# Patient Record
Sex: Female | Born: 1980 | Race: White | Hispanic: No | Marital: Single | Smoking: Never smoker
Health system: Southern US, Community
[De-identification: ages and names within clinical notes are randomized; demographics above are authoritative.]

## PROBLEM LIST (undated history)

## (undated) DIAGNOSIS — R011 Cardiac murmur, unspecified: Secondary | ICD-10-CM

## (undated) DIAGNOSIS — R87629 Unspecified abnormal cytological findings in specimens from vagina: Secondary | ICD-10-CM

## (undated) DIAGNOSIS — Z8489 Family history of other specified conditions: Secondary | ICD-10-CM

## (undated) DIAGNOSIS — T8859XA Other complications of anesthesia, initial encounter: Secondary | ICD-10-CM

## (undated) HISTORY — PX: REMOVAL OF BILATERAL TISSUE EXPANDERS WITH PLACEMENT OF BILATERAL BREAST IMPLANTS: SHX6431

## (undated) HISTORY — PX: FACIAL RECONSTRUCTION SURGERY: SHX631

## (undated) HISTORY — DX: Unspecified abnormal cytological findings in specimens from vagina: R87.629

## (undated) HISTORY — PX: CRYOTHERAPY: SHX1416

---

## 2016-07-12 ENCOUNTER — Ambulatory Visit (INDEPENDENT_AMBULATORY_CARE_PROVIDER_SITE_OTHER): Payer: BLUE CROSS/BLUE SHIELD | Admitting: Family Medicine

## 2016-07-12 ENCOUNTER — Encounter: Payer: Self-pay | Admitting: Family Medicine

## 2016-07-12 VITALS — BP 119/75 | HR 52 | Ht 66.0 in | Wt 163.0 lb

## 2016-07-12 DIAGNOSIS — Z113 Encounter for screening for infections with a predominantly sexual mode of transmission: Secondary | ICD-10-CM | POA: Diagnosis not present

## 2016-07-12 DIAGNOSIS — N941 Unspecified dyspareunia: Secondary | ICD-10-CM

## 2016-07-12 DIAGNOSIS — Z124 Encounter for screening for malignant neoplasm of cervix: Secondary | ICD-10-CM

## 2016-07-12 DIAGNOSIS — Z01419 Encounter for gynecological examination (general) (routine) without abnormal findings: Secondary | ICD-10-CM | POA: Diagnosis not present

## 2016-07-12 DIAGNOSIS — N939 Abnormal uterine and vaginal bleeding, unspecified: Secondary | ICD-10-CM | POA: Diagnosis not present

## 2016-07-12 DIAGNOSIS — Z1151 Encounter for screening for human papillomavirus (HPV): Secondary | ICD-10-CM | POA: Diagnosis not present
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
## 2016-07-17 LAB — CYTOLOGY - PAP

## 2016-09-16 ENCOUNTER — Encounter (HOSPITAL_BASED_OUTPATIENT_CLINIC_OR_DEPARTMENT_OTHER): Payer: Self-pay | Admitting: *Deleted

## 2016-09-16 ENCOUNTER — Emergency Department (HOSPITAL_BASED_OUTPATIENT_CLINIC_OR_DEPARTMENT_OTHER)
Admission: EM | Admit: 2016-09-16 | Discharge: 2016-09-16 | Disposition: A | Discharge status: Home / Self Care | Payer: BLUE CROSS/BLUE SHIELD | Attending: Emergency Medicine | Admitting: Emergency Medicine

## 2016-09-16 DIAGNOSIS — X58XXXA Exposure to other specified factors, initial encounter: Secondary | ICD-10-CM | POA: Diagnosis not present

## 2016-09-16 DIAGNOSIS — Y999 Unspecified external cause status: Secondary | ICD-10-CM | POA: Insufficient documentation

## 2016-09-16 DIAGNOSIS — Y939 Activity, unspecified: Secondary | ICD-10-CM | POA: Insufficient documentation

## 2016-09-16 DIAGNOSIS — S161XXA Strain of muscle, fascia and tendon at neck level, initial encounter: Secondary | ICD-10-CM | POA: Insufficient documentation

## 2016-09-16 DIAGNOSIS — S199XXA Unspecified injury of neck, initial encounter: Secondary | ICD-10-CM | POA: Diagnosis present

## 2016-09-16 DIAGNOSIS — Y929 Unspecified place or not applicable: Secondary | ICD-10-CM | POA: Insufficient documentation

## 2016-09-16 MED ORDER — OXYCODONE-ACETAMINOPHEN 5-325 MG PO TABS: 1.0000 | ORAL_TABLET | ORAL | 0 refills | Status: DC | PRN

## 2016-09-16 MED ORDER — NAPROXEN 500 MG PO TABS: 500.0000 mg | ORAL_TABLET | Freq: Two times a day (BID) | ORAL | 0 refills | Status: DC

## 2016-09-16 MED ORDER — ORPHENADRINE CITRATE ER 100 MG PO TB12: 100.0000 mg | ORAL_TABLET | Freq: Two times a day (BID) | ORAL | 0 refills | Status: DC

## 2016-09-16 NOTE — ED Triage Notes (Signed)
Pt reports right shoulder pain x 3 days.  Denies known injury.  Increased pain with movement.

## 2016-09-16 NOTE — Discharge Instructions (Signed)
Apply ice as needed. Take acetaminophen as needed.

## 2016-09-16 NOTE — ED Provider Notes (Signed)
[facility]   CSN: [id number] Arrival date & time: 09/16/16  0010     History   Chief Complaint Chief Complaint  Patient presents with  . Shoulder Pain    HPI Nancy Hendricks is a 35 y.o. female.  She has had pain in the superior aspect of her shoulder adjacent to her neck for the last 3 days. Pain is only on the right side and she describes a dull, throbbing pain. Is worse with certain movements and worse with swallowing and sneezing. She has been taking ibuprofen without relief. She tried applying ice without relief. Heat seemed to help yesterday, but then pain recurred today and got worse when she applied heat. Symptoms started one day after she had done some overhead work with weights at the gym. She denies weakness, numbness, tingling. She currently rates pain at 8/10 at rest, 10/10 with movement.   The history is provided by the patient.    Past Medical History:  Diagnosis Date  . Vaginal Pap smear, abnormal     There are no active problems to display for this patient.   Past Surgical History:  Procedure Laterality Date  . CRYOTHERAPY      OB History    Gravida Para Term Preterm AB Living   0 0 0 0 0 0   SAB TAB Ectopic Multiple Live Births   0 0 0 0 0       Home Medications    Prior to Admission medications   Not on File    Family History Family History  Problem Relation Age of Onset  . Hypertension Father   . Cancer Neg Hx   . Diabetes Neg Hx     Social History Social History  Substance Use Topics  . Smoking status: Never Smoker  . Smokeless tobacco: Never Used  . Alcohol use 1.2 oz/week    2 Glasses of wine per week     Allergies   Patient has no known allergies.   Review of Systems Review of Systems  All other systems reviewed and are negative.    Physical Exam Updated Vital Signs BP 146/98 (BP Location: Left Arm)   Pulse 60   Temp 98.1 F (36.7 C) (Oral)   Resp 18   Ht 5\' 6"  (1.676 m)   Wt 160  lb (72.6 kg)   LMP 09/09/2016   SpO2 98%   BMI 25.82 kg/m   Physical Exam  Nursing note and vitals reviewed.  35 year old female, resting comfortably and in no acute distress. Vital signs are significant for hypertension. Oxygen saturation is 98%, which is normal. Head is normocephalic and atraumatic. PERRLA, EOMI. Oropharynx is clear. Neck has moderate spasm of paracervical muscles bilaterally without tenderness. There is pain on passive range of motion of the neck with rotation and lateral flexion in either direction. There is no adenopathy or JVD. Back is nontender and there is no CVA tenderness. Lungs are clear without rales, wheezes, or rhonchi. Chest is nontender. Heart has regular rate and rhythm without murmur. Abdomen is soft, flat, nontender without masses or hepatosplenomegaly and peristalsis is normoactive. Extremities have no cyanosis or edema, full range of motion is present. Skin is warm and dry without rash. There is mild tenderness to the soft tissues of the proximal aspect of the deltoid muscle on the right. Neurologic: Mental status is normal, cranial nerves are intact, there are no motor or sensory deficits.  ED Treatments / Results   Procedures  Procedures (including critical care time)  Medications Ordered in ED Medications - No data to display   Initial Impression / Assessment and Plan / ED Course  I have reviewed the triage vital signs and the nursing notes.   Clinical Course    Proximal shoulder pain which seems to be related to strain of paracervical muscles. No evidence of any neurologic injury. She is sad discharged with prescriptions for orphenadrine, naproxen, and oxycodone have acetaminophen. Advised on using ice. Referred to sports medicine for follow-up.  Final Clinical Impressions(s) / ED Diagnoses   Final diagnoses:  Strain of neck muscle, initial encounter    New Prescriptions New Prescriptions   NAPROXEN (NAPROSYN) 500 MG TABLET     Take 1 tablet (500 mg total) by mouth 2 (two) times daily.   ORPHENADRINE (NORFLEX) 100 MG TABLET    Take 1 tablet (100 mg total) by mouth 2 (two) times daily.   OXYCODONE-ACETAMINOPHEN (PERCOCET) 5-325 MG TABLET    Take 1 tablet by mouth every 4 (four) hours as needed for moderate pain.     Dione Boozeavid Nyeem Stoke, MD 09/16/16 [phone]

## 2018-12-08 ENCOUNTER — Other Ambulatory Visit: Payer: Self-pay | Admitting: *Deleted

## 2018-12-08 DIAGNOSIS — Z124 Encounter for screening for malignant neoplasm of cervix: Secondary | ICD-10-CM

## 2018-12-09 NOTE — Progress Notes (Signed)
Patient: Nancy Hendricks           Date of Birth: Jan 23, 1981           MRN: [id number] Visit Date: 12/08/2018 PCP: Patient, No Pcp Per  Cervical Cancer Screening Do you smoke?: No Have you ever had or been told you have an allergy to latex products?: No Marital status: Divorce Date of last pap smear: 2-5 yrs ago Date of last menstrual period: 11/24/18 Number of pregnancies: 0 Number of births: 0 Have you ever had any of the following? Hysterectomy: No Tubal ligation (tubes tied): No Abnormal bleeding: Don't Know Abnormal pap smear: Yes Venereal warts: No A sex partner with venereal warts: Yes A high risk* sex partner: No  Cervical Exam Exam not completed.  Patient's History There are no active problems to display for this patient.  Past Medical History:  Diagnosis Date  . Vaginal Pap smear, abnormal     Family History  Problem Relation Age of Onset  . Hypertension Father   . Cancer Neg Hx   . Diabetes Neg Hx     Social History   Occupational History  . Not on file  Tobacco Use  . Smoking status: Never Smoker  . Smokeless tobacco: Never Used  Substance and Sexual Activity  . Alcohol use: Yes    Alcohol/week: 2.0 standard drinks    Types: 2 Glasses of wine per week  . Drug use: No  . Sexual activity: Never    Birth control/protection: Condom   Patient examined by [employer], ANP. Visit notes scanned into Epic.

## 2018-12-15 LAB — CYTOLOGY - PAP
Diagnosis: UNDETERMINED
HPV (WINDOPATH): NOT DETECTED

## 2018-12-30 ENCOUNTER — Telehealth (HOSPITAL_COMMUNITY): Payer: Self-pay | Admitting: *Deleted

## 2018-12-30 NOTE — Telephone Encounter (Signed)
Telephoned patient at home number and phone rings and then hangs up.

## 2019-01-09 ENCOUNTER — Telehealth (HOSPITAL_COMMUNITY): Payer: Self-pay | Admitting: *Deleted

## 2019-01-09 NOTE — Telephone Encounter (Signed)
Telephoned patient at home number and left message to return call. Need to advise patient of pap smear results.

## 2019-01-09 NOTE — Telephone Encounter (Signed)
Patient returned call and advised patient of abnormal pap smear results but HPV was negative. Advised patient with that result would just need repeat pap in one year. Patient voiced understanding.

## 2019-01-19 ENCOUNTER — Encounter (HOSPITAL_COMMUNITY): Payer: Self-pay

## 2019-03-04 ENCOUNTER — Telehealth: Payer: Self-pay

## 2019-03-04 NOTE — Telephone Encounter (Signed)
Pt called the office to stating that she needs to schedule a colpo because she had an abnormal pap. Explained to pt that per the note from 01/09/19 she was told that she will need to repeat her pap in one year. Pt got upset and decided to cancel appt. Tahira Olivarez l Anyely Cunning, CMA

## 2019-04-06 ENCOUNTER — Encounter: Payer: Self-pay | Admitting: Family Medicine

## 2019-09-08 ENCOUNTER — Telehealth: Payer: Self-pay | Admitting: Family Medicine

## 2019-09-08 NOTE — Telephone Encounter (Signed)
Patient called to schedule an appointment. I informed her she had an appointment in [facility], but it was canceled by staff she stated and not her. She was not very happy because it's been so long and no one has spoken to her. She stated she did have insurance in June.

## 2019-09-09 ENCOUNTER — Telehealth: Payer: Self-pay

## 2019-09-09 NOTE — Telephone Encounter (Signed)
Called patient and apologized for the confusion for her thinking that she needed another colposcopy and advised that it is correct that she would need another Papsmear in Feb 2021. Patient verbalized understanding and stated that it was okay and that she was informed and will call us closer to february to get an appointment.   Patient verbalized understanding and ended the call.

## 2019-10-14 ENCOUNTER — Encounter: Payer: Self-pay | Admitting: Family Medicine

## 2019-10-22 ENCOUNTER — Ambulatory Visit: Payer: Self-pay | Admitting: Obstetrics & Gynecology

## 2020-01-16 ENCOUNTER — Ambulatory Visit: Payer: Self-pay | Attending: Internal Medicine

## 2020-01-16 DIAGNOSIS — Z23 Encounter for immunization: Secondary | ICD-10-CM

## 2020-01-16 NOTE — Progress Notes (Signed)
   Covid-19 Vaccination Clinic  Name:  Nancy Hendricks    MRN: [id number] DOB: Jun 15, 1981  01/16/2020  [name] was observed post Covid-19 immunization for 15 minutes without incident. She was provided with Vaccine Information Sheet and instruction to access the V-Safe system.   [name] was instructed to call 911 with any severe reactions post vaccine: Marland Kitchen Difficulty breathing  . Swelling of face and throat  . A fast heartbeat  . A bad rash all over body  . Dizziness and weakness   Immunizations Administered    Name Date Dose VIS Date Route   Pfizer COVID-19 Vaccine 01/16/2020  9:33 AM 0.3 mL 09/25/2019 Intramuscular   Manufacturer: [employer], [employer]   Lot: [id number]   NDC: [address]-6

## 2020-02-10 ENCOUNTER — Ambulatory Visit: Payer: Self-pay

## 2020-03-24 ENCOUNTER — Telehealth: Payer: Self-pay | Admitting: Allergy & Immunology

## 2020-03-24 NOTE — Telephone Encounter (Signed)
Left voicemail for patient to call office back regarding voicemail that was left for new patient appointment for allergic reaction.

## 2020-03-29 ENCOUNTER — Ambulatory Visit: Payer: Self-pay | Admitting: Allergy

## 2020-04-19 ENCOUNTER — Ambulatory Visit: Payer: Self-pay | Admitting: Allergy

## 2020-11-06 ENCOUNTER — Other Ambulatory Visit: Payer: Self-pay

## 2020-11-06 ENCOUNTER — Encounter (HOSPITAL_BASED_OUTPATIENT_CLINIC_OR_DEPARTMENT_OTHER): Payer: Self-pay | Admitting: Emergency Medicine

## 2020-11-06 ENCOUNTER — Emergency Department (HOSPITAL_BASED_OUTPATIENT_CLINIC_OR_DEPARTMENT_OTHER): Payer: BLUE CROSS/BLUE SHIELD

## 2020-11-06 ENCOUNTER — Emergency Department (HOSPITAL_BASED_OUTPATIENT_CLINIC_OR_DEPARTMENT_OTHER)
Admission: EM | Admit: 2020-11-06 | Discharge: 2020-11-06 | Disposition: A | Discharge status: Home / Self Care | Payer: BLUE CROSS/BLUE SHIELD | Attending: Emergency Medicine | Admitting: Emergency Medicine

## 2020-11-06 DIAGNOSIS — K8020 Calculus of gallbladder without cholecystitis without obstruction: Secondary | ICD-10-CM | POA: Diagnosis not present

## 2020-11-06 DIAGNOSIS — R1011 Right upper quadrant pain: Secondary | ICD-10-CM

## 2020-11-06 LAB — CBC
HCT: 38.1 % (ref 36.0–46.0)
Hemoglobin: 12.3 g/dL (ref 12.0–15.0)
MCH: 27.5 pg (ref 26.0–34.0)
MCHC: 32.3 g/dL (ref 30.0–36.0)
MCV: 85 fL (ref 80.0–100.0)
Platelets: 226 10*3/uL (ref 150–400)
RBC: 4.48 MIL/uL (ref 3.87–5.11)
RDW: 14.7 % (ref 11.5–15.5)
WBC: 5.6 10*3/uL (ref 4.0–10.5)
nRBC: 0 % (ref 0.0–0.2)

## 2020-11-06 LAB — COMPREHENSIVE METABOLIC PANEL
ALT: 11 U/L (ref 0–44)
AST: 17 U/L (ref 15–41)
Albumin: 3.8 g/dL (ref 3.5–5.0)
Alkaline Phosphatase: 41 U/L (ref 38–126)
Anion gap: 9 (ref 5–15)
BUN: 15 mg/dL (ref 6–20)
CO2: 27 mmol/L (ref 22–32)
Calcium: 9 mg/dL (ref 8.9–10.3)
Chloride: 104 mmol/L (ref 98–111)
Creatinine, Ser: 0.88 mg/dL (ref 0.44–1.00)
GFR, Estimated: >60 mL/min (ref >60)
Glucose, Bld: 100 mg/dL — ABNORMAL HIGH (ref 70–99)
Potassium: 3.9 mmol/L (ref 3.5–5.1)
Sodium: 140 mmol/L (ref 135–145)
Total Bilirubin: 0.2 mg/dL — ABNORMAL LOW (ref 0.3–1.2)
Total Protein: 6.1 g/dL — ABNORMAL LOW (ref 6.5–8.1)

## 2020-11-06 LAB — URINALYSIS, ROUTINE W REFLEX MICROSCOPIC
Bilirubin Urine: NEGATIVE
Glucose, UA: NEGATIVE mg/dL
Ketones, ur: NEGATIVE mg/dL
Leukocytes,Ua: NEGATIVE
Nitrite: NEGATIVE
Protein, ur: NEGATIVE mg/dL
Specific Gravity, Urine: 1.02 (ref 1.005–1.030)
pH: 6.5 (ref 5.0–8.0)

## 2020-11-06 LAB — URINALYSIS, MICROSCOPIC (REFLEX): RBC / HPF: >50 RBC/hpf (ref 0–5)

## 2020-11-06 LAB — PREGNANCY, URINE: Preg Test, Ur: NEGATIVE

## 2020-11-06 MED ORDER — DICYCLOMINE HCL 10 MG/ML IM SOLN
20.0000 mg | Freq: Once | INTRAMUSCULAR | Status: AC
  Administered 2020-11-06: 20 mg via INTRAMUSCULAR
  Filled 2020-11-06: qty 2

## 2020-11-06 MED ORDER — KETOROLAC TROMETHAMINE 30 MG/ML IJ SOLN
30.0000 mg | Freq: Once | INTRAMUSCULAR | Status: AC
  Administered 2020-11-06: 30 mg via INTRAVENOUS
  Filled 2020-11-06: qty 1

## 2020-11-06 MED ORDER — OMEPRAZOLE 20 MG PO CPDR: 20.0000 mg | DELAYED_RELEASE_CAPSULE | Freq: Every day | ORAL | 0 refills | Status: AC

## 2020-11-06 MED ORDER — ALUM & MAG HYDROXIDE-SIMETH 200-200-20 MG/5ML PO SUSP
30.0000 mL | Freq: Once | ORAL | Status: AC
  Administered 2020-11-06: 30 mL via ORAL
  Filled 2020-11-06: qty 30

## 2020-11-06 MED ORDER — DICYCLOMINE HCL 20 MG PO TABS: 20.0000 mg | ORAL_TABLET | Freq: Two times a day (BID) | ORAL | 0 refills | Status: DC

## 2020-11-06 NOTE — ED Triage Notes (Signed)
RUQ abd pain that started ~ 12 hours ago. Pt does endorse nausea and states the pain has worsened over that period. Pt states she is very tender over this area, and when she points to it to show RN she yells out. Denies fevers, vomiting, diarrhea, or constipation.

## 2020-11-06 NOTE — ED Provider Notes (Signed)
[facility] EMERGENCY DEPARTMENT Provider Note   CSN: [id number] Arrival date & time: 11/06/20  [id number]     History Chief Complaint  Patient presents with  . Abdominal Pain    Nancy Bostonngela Todt is a 40 y.o. female.  The history is provided by the patient.  Abdominal Pain Pain location:  RUQ Pain quality: cramping   Pain quality: not burning, no fullness and no pressure   Pain radiates to:  Does not radiate Onset quality:  Sudden Duration:  14 hours Timing:  Constant Progression:  Unchanged Chronicity:  New Context: not retching and not sick contacts   Relieved by:  Nothing Worsened by:  Nothing Ineffective treatments:  None tried Associated symptoms: no anorexia, no belching, no chest pain, no constipation, no cough, no dysuria, no fatigue, no fever, no flatus, no nausea, no shortness of breath, no sore throat and no vomiting   Associated symptoms comment:  Is currently on her period Risk factors: no alcohol abuse   Patient was doing house work at 3 pm and had sudden onset RUQ pain.  Not meal related in fact thought she was hungry and ate and it did not affect the patin.      Past Medical History:  Diagnosis Date  . Vaginal Pap smear, abnormal     There are no problems to display for this patient.   Past Surgical History:  Procedure Laterality Date  . CRYOTHERAPY    . FACIAL RECONSTRUCTION SURGERY       OB History    Gravida  0   Para  0   Term  0   Preterm  0   AB  0   Living  0     SAB  0   IAB  0   Ectopic  0   Multiple  0   Live Births  0           Family History  Problem Relation Age of Onset  . Hypertension Father   . Cancer Neg Hx   . Diabetes Neg Hx     Social History   Tobacco Use  . Smoking status: Never Smoker  . Smokeless tobacco: Never Used  Substance Use Topics  . Alcohol use: Yes    Alcohol/week: 2.0 standard drinks    Types: 2 Glasses of wine per week  . Drug use: No    Home Medications Prior to  Admission medications   Medication Sig Start Date End Date Taking? Authorizing Provider  naproxen (NAPROSYN) 500 MG tablet Take 1 tablet (500 mg total) by mouth 2 (two) times daily. 09/16/16   Dione BoozeGlick, David, MD  orphenadrine (NORFLEX) 100 MG tablet Take 1 tablet (100 mg total) by mouth 2 (two) times daily. 09/16/16   Dione BoozeGlick, David, MD  oxyCODONE-acetaminophen (PERCOCET) 5-325 MG tablet Take 1 tablet by mouth every 4 (four) hours as needed for moderate pain. 09/16/16   Dione BoozeGlick, David, MD    Allergies    Patient has no known allergies.  Review of Systems   Review of Systems  Constitutional: Negative for fatigue and fever.  HENT: Negative for sore throat.   Eyes: Negative for visual disturbance.  Respiratory: Negative for cough and shortness of breath.   Cardiovascular: Negative for chest pain.  Gastrointestinal: Positive for abdominal pain. Negative for anorexia, constipation, flatus, nausea and vomiting.  Genitourinary: Negative for dysuria.  Musculoskeletal: Negative for arthralgias.  Skin: Negative for rash.  Neurological: Negative for dizziness.  Psychiatric/Behavioral: Negative for agitation.  All  other systems reviewed and are negative.   Physical Exam Updated Vital Signs BP (!) 143/91 (BP Location: Right Arm)   Pulse 62   Temp 98.8 F (37.1 C) (Oral)   Resp 18   Ht 5\' 3"  (1.6 m)   Wt 72.6 kg   LMP 11/04/2020   SpO2 100%   BMI 28.34 kg/m   Physical Exam Vitals and nursing note reviewed.  Constitutional:      General: She is not in acute distress.    Appearance: Normal appearance.  HENT:     Head: Normocephalic and atraumatic.     Nose: Nose normal.  Eyes:     Conjunctiva/sclera: Conjunctivae normal.     Pupils: Pupils are equal, round, and reactive to light.  Cardiovascular:     Rate and Rhythm: Normal rate and regular rhythm.     Pulses: Normal pulses.     Heart sounds: Normal heart sounds.  Pulmonary:     Effort: Pulmonary effort is normal.     Breath sounds:  Normal breath sounds.  Abdominal:     General: Abdomen is flat. Bowel sounds are normal.     Palpations: Abdomen is soft.     Tenderness: There is no abdominal tenderness. There is no guarding or rebound.  Musculoskeletal:        General: Normal range of motion.     Cervical back: Normal range of motion and neck supple.  Skin:    General: Skin is warm and dry.     Capillary Refill: Capillary refill takes less than 2 seconds.  Neurological:     General: No focal deficit present.     Mental Status: She is alert and oriented to person, place, and time.     Deep Tendon Reflexes: Reflexes normal.  Psychiatric:        Mood and Affect: Mood normal.        Behavior: Behavior normal.     ED Results / Procedures / Treatments   Labs (all labs ordered are listed, but only abnormal results are displayed) Results for orders placed or performed during the hospital encounter of 11/06/20  Urinalysis, Routine w reflex microscopic  Result Value Ref Range   Color, Urine YELLOW YELLOW   APPearance CLOUDY (A) CLEAR   Specific Gravity, Urine 1.020 1.005 - 1.030   pH 6.5 5.0 - 8.0   Glucose, UA NEGATIVE NEGATIVE mg/dL   Hgb urine dipstick LARGE (A) NEGATIVE   Bilirubin Urine NEGATIVE NEGATIVE   Ketones, ur NEGATIVE NEGATIVE mg/dL   Protein, ur NEGATIVE NEGATIVE mg/dL   Nitrite NEGATIVE NEGATIVE   Leukocytes,Ua NEGATIVE NEGATIVE  Pregnancy, urine  Result Value Ref Range   Preg Test, Ur NEGATIVE NEGATIVE  Comprehensive metabolic panel  Result Value Ref Range   Sodium 140 135 - 145 mmol/L   Potassium 3.9 3.5 - 5.1 mmol/L   Chloride 104 98 - 111 mmol/L   CO2 27 22 - 32 mmol/L   Glucose, Bld 100 (H) 70 - 99 mg/dL   BUN 15 6 - 20 mg/dL   Creatinine, Ser 11/08/20 0.44 - 1.00 mg/dL   Calcium 9.0 8.9 - 9.21 mg/dL   Total Protein 6.1 (L) 6.5 - 8.1 g/dL   Albumin 3.8 3.5 - 5.0 g/dL   AST 17 15 - 41 U/L   ALT 11 0 - 44 U/L   Alkaline Phosphatase 41 38 - 126 U/L   Total Bilirubin 0.2 (L) 0.3 - 1.2  mg/dL   GFR, Estimated 19.4 >17  mL/min   Anion gap 9 5 - 15  CBC  Result Value Ref Range   WBC 5.6 4.0 - 10.5 K/uL   RBC 4.48 3.87 - 5.11 MIL/uL   Hemoglobin 12.3 12.0 - 15.0 g/dL   HCT 01.0 27.2 - 53.6 %   MCV 85.0 80.0 - 100.0 fL   MCH 27.5 26.0 - 34.0 pg   MCHC 32.3 30.0 - 36.0 g/dL   RDW 64.4 03.4 - 74.2 %   Platelets 226 150 - 400 K/uL   nRBC 0.0 0.0 - 0.2 %  Urinalysis, Microscopic (reflex)  Result Value Ref Range   RBC / HPF >50 0 - 5 RBC/hpf   WBC, UA 0-5 0 - 5 WBC/hpf   Bacteria, UA RARE (A) NONE SEEN   Squamous Epithelial / LPF 0-5 0 - 5   US Abdomen Limited RUQ (LIVER/GB)  Result Date: 11/06/2020 CLINICAL DATA:  Right upper quadrant abdominal pain EXAM: ULTRASOUND ABDOMEN LIMITED RIGHT UPPER QUADRANT COMPARISON:  None. FINDINGS: Gallbladder: A tiny amount of layering sludge is seen within the gallbladder lumen. The gallbladder, however, is not distended, there is no gallbladder wall thickening, and no pericholecystic fluid is identified. The sonographic Eulah Pont sign is reportedly positive. Common bile duct: Diameter: 3 mm Liver: No focal lesion identified. Within normal limits in parenchymal echogenicity. Portal vein is patent on color Doppler imaging with normal direction of blood flow towards the liver. Other: None. IMPRESSION: Cholelithiasis without sonographic evidence of acute cholecystitis. Electronically Signed   By: Helyn Numbers MD   On: 11/06/2020 05:23    Radiology No results found.  Procedures Procedures (including critical care time)  Medications Ordered in ED Medications  ketorolac (TORADOL) 30 MG/ML injection 30 mg (30 mg Intravenous Given 11/06/20 0516)  dicyclomine (BENTYL) injection 20 mg (20 mg Intramuscular Given 11/06/20 0521)  alum & mag hydroxide-simeth (MAALOX/MYLANTA) 200-200-20 MG/5ML suspension 30 mL (30 mLs Oral Given 11/06/20 5956)    ED Course  I have reviewed the triage vital signs and the nursing notes.  Pertinent labs & imaging  results that were available during my care of the patient were reviewed by me and considered in my medical decision making (see chart for details).   I am treating the patient as cholelithiasis without cholecystitis.  Have advised medication, bland diet and close follow up with general surgery as an outpatient.  Strict return precautions given.  Nancy Hendricks was evaluated in Emergency Department on 11/06/2020 for the symptoms described in the history of present illness. She was evaluated in the context of the global COVID-19 pandemic, which necessitated consideration that the patient might be at risk for infection with the SARS-CoV-2 virus that causes COVID-19. Institutional protocols and algorithms that pertain to the evaluation of patients at risk for COVID-19 are in a state of rapid change based on information released by regulatory bodies including the CDC and federal and state organizations. These policies and algorithms were followed during the patient's care in the ED.  Final Clinical Impression(s) / ED Diagnoses Return for intractable cough, coughing up blood, fevers >100.4 unrelieved by medication, shortness of breath, intractable vomiting, chest pain, shortness of breath, weakness, numbness, changes in speech, facial asymmetry, abdominal pain, passing out, Inability to tolerate liquids or food, cough, altered mental status or any concerns. No signs of systemic illness or infection. The patient is nontoxic-appearing on exam and vital signs are within normal limits.  I have reviewed the triage vital signs and the nursing notes. Pertinent labs & imaging  results that were available during my care of the patient were reviewed by me and considered in my medical decision making (see chart for details). After history, exam, and medical workup I feel the patient has been appropriately medically screened and is safe for discharge home. Pertinent diagnoses were discussed with the patient. Patient was given  return precautions.    Americus Scheurich, MD 11/06/20 [phone]

## 2020-11-14 ENCOUNTER — Ambulatory Visit: Payer: Self-pay | Admitting: Surgery
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
## 2020-11-15 ENCOUNTER — Encounter (HOSPITAL_COMMUNITY): Payer: Self-pay

## 2020-11-15 ENCOUNTER — Encounter (HOSPITAL_COMMUNITY)
Admission: RE | Admit: 2020-11-15 | Discharge: 2020-11-15 | Disposition: A | Discharge status: Home / Self Care | Payer: BLUE CROSS/BLUE SHIELD | Source: Ambulatory Visit | Attending: Surgery | Admitting: Surgery

## 2020-11-15 ENCOUNTER — Other Ambulatory Visit: Payer: Self-pay

## 2020-11-15 DIAGNOSIS — Z01812 Encounter for preprocedural laboratory examination: Secondary | ICD-10-CM | POA: Diagnosis present

## 2020-11-15 HISTORY — DX: Cardiac murmur, unspecified: R01.1

## 2020-11-15 HISTORY — DX: Other complications of anesthesia, initial encounter: T88.59XA

## 2020-11-15 HISTORY — DX: Family history of other specified conditions: Z84.89

## 2020-11-15 LAB — COMPREHENSIVE METABOLIC PANEL
ALT: 15 U/L (ref 0–44)
AST: 21 U/L (ref 15–41)
Albumin: 4.5 g/dL (ref 3.5–5.0)
Alkaline Phosphatase: 46 U/L (ref 38–126)
Anion gap: 9 (ref 5–15)
BUN: 15 mg/dL (ref 6–20)
CO2: 26 mmol/L (ref 22–32)
Calcium: 9.6 mg/dL (ref 8.9–10.3)
Chloride: 105 mmol/L (ref 98–111)
Creatinine, Ser: 0.88 mg/dL (ref 0.44–1.00)
GFR, Estimated: >60 mL/min (ref >60)
Glucose, Bld: 89 mg/dL (ref 70–99)
Potassium: 4.1 mmol/L (ref 3.5–5.1)
Sodium: 140 mmol/L (ref 135–145)
Total Bilirubin: 0.8 mg/dL (ref 0.3–1.2)
Total Protein: 7.2 g/dL (ref 6.5–8.1)

## 2020-11-15 LAB — CBC WITH DIFFERENTIAL/PLATELET
Abs Immature Granulocytes: 0.02 10*3/uL (ref 0.00–0.07)
Basophils Absolute: 0 10*3/uL (ref 0.0–0.1)
Basophils Relative: 1 %
Eosinophils Absolute: 0.1 10*3/uL (ref 0.0–0.5)
Eosinophils Relative: 1 %
HCT: 41.8 % (ref 36.0–46.0)
Hemoglobin: 13.2 g/dL (ref 12.0–15.0)
Immature Granulocytes: 0 %
Lymphocytes Relative: 32 %
Lymphs Abs: 2 10*3/uL (ref 0.7–4.0)
MCH: 27.5 pg (ref 26.0–34.0)
MCHC: 31.6 g/dL (ref 30.0–36.0)
MCV: 87.1 fL (ref 80.0–100.0)
Monocytes Absolute: 0.3 10*3/uL (ref 0.1–1.0)
Monocytes Relative: 5 %
Neutro Abs: 3.8 10*3/uL (ref 1.7–7.7)
Neutrophils Relative %: 61 %
Platelets: 283 10*3/uL (ref 150–400)
RBC: 4.8 MIL/uL (ref 3.87–5.11)
RDW: 14.7 % (ref 11.5–15.5)
WBC: 6.3 10*3/uL (ref 4.0–10.5)
nRBC: 0 % (ref 0.0–0.2)

## 2020-11-15 LAB — PROTIME-INR
INR: 1 (ref 0.8–1.2)
Prothrombin Time: 13 seconds (ref 11.4–15.2)

## 2020-11-15 LAB — APTT: aPTT: 27 seconds (ref 24–36)

## 2020-11-15 NOTE — Progress Notes (Signed)
DUE TO COVID-19 ONLY ONE VISITOR IS ALLOWED TO COME WITH YOU AND STAY IN THE WAITING ROOM ONLY DURING PRE OP AND PROCEDURE DAY OF SURGERY. THE 1 VISITOR  MAY VISIT WITH YOU AFTER SURGERY IN YOUR PRIVATE ROOM DURING VISITING HOURS ONLY!  YOU NEED TO HAVE A COVID 19 TEST ON___2/07/2021 ____ @_______ , THIS TEST MUST BE DONE BEFORE SURGERY,  COVID TESTING SITE [address] [place] , IT IS ON THE RIGHT GOING OUT [address] APPROXIMATELY  2 MINUTES PAST [employer] ON THE RIGHT. ONCE YOUR COVID TEST IS COMPLETED,  PLEASE BEGIN THE QUARANTINE INSTRUCTIONS AS OUTLINED IN YOUR HANDOUT.                Nancy Hendricks  11/15/2020   Your procedure is scheduled on: 11/28/2020    Report to [facility] Main  Entrance   Report to admitting at    0700 AM     Call this number if you have problems the morning of surgery [phone]    Remember: Do not eat food , candy gum or mints :After Midnight. You may have clear liquids from midnight until 0600am     CLEAR LIQUID DIET   Foods Allowed                                                                       Coffee and tea, regular and decaf                              Plain Jell-O any favor except red or purple                                            Fruit ices (not with fruit pulp)                                      Iced Popsicles                                     Carbonated beverages, regular and diet                                    Cranberry, grape and apple juices Sports drinks like Gatorade Lightly seasoned clear broth or consume(fat free) Sugar, honey syrup   _____________________________________________________________________    BRUSH YOUR TEETH MORNING OF SURGERY AND RINSE YOUR MOUTH OUT, NO CHEWING GUM CANDY OR MINTS.     Take these medicines the morning of surgery with A SIP OF WATER:  prilosec DO NOT TAKE ANY DIABETIC MEDICATIONS DAY OF YOUR SURGERY                                You may not have any metal on your body including  hair pins and              piercings  Do not wear jewelry, make-up, lotions, powders or perfumes, deodorant             Do not wear nail polish on your fingernails.  Do not shave  48 hours prior to surgery.              Men may shave face and neck.   Do not bring valuables to the hospital. [place].  Contacts, dentures or bridgework may not be worn into surgery.  Leave suitcase in the car. After surgery it may be brought to your room.     Patients discharged the day of surgery will not be allowed to drive home. IF YOU ARE HAVING SURGERY AND GOING HOME THE SAME DAY, YOU MUST HAVE AN ADULT TO DRIVE YOU HOME AND BE WITH YOU FOR 24 HOURS. YOU MAY GO HOME BY TAXI OR UBER OR ORTHERWISE, BUT AN ADULT MUST ACCOMPANY YOU HOME AND STAY WITH YOU FOR 24 HOURS.  Name and phone number of your driver:  Special Instructions: N/A              Please read over the following fact sheets you were given: _____________________________________________________________________  [facility] - Preparing for Surgery Before surgery, you can play an important role.  Because skin is not sterile, your skin needs to be as free of germs as possible.  You can reduce the number of germs on your skin by washing with CHG (chlorahexidine gluconate) soap before surgery.  CHG is an antiseptic cleaner which kills germs and bonds with the skin to continue killing germs even after washing. Please DO NOT use if you have an allergy to CHG or antibacterial soaps.  If your skin becomes reddened/irritated stop using the CHG and inform your nurse when you arrive at Short Stay. Do not shave (including legs and underarms) for at least 48 hours prior to the first CHG shower.  You may shave your face/neck. Please follow these instructions carefully:  1.  Shower with CHG Soap the night before surgery and the  morning of Surgery.  2.  If you  choose to wash your hair, wash your hair first as usual with your  normal  shampoo.  3.  After you shampoo, rinse your hair and body thoroughly to remove the  shampoo.                           4.  Use CHG as you would any other liquid soap.  You can apply chg directly  to the skin and wash                       Gently with a scrungie or clean washcloth.  5.  Apply the CHG Soap to your body ONLY FROM THE NECK DOWN.   Do not use on face/ open                           Wound or open sores. Avoid contact with eyes, ears mouth and genitals (private parts).                       Wash face,  Genitals (private parts) with  your normal soap.             6.  Wash thoroughly, paying special attention to the area where your surgery  will be performed.  7.  Thoroughly rinse your body with warm water from the neck down.  8.  DO NOT shower/wash with your normal soap after using and rinsing off  the CHG Soap.                9.  Pat yourself dry with a clean towel.            10.  Wear clean pajamas.            11.  Place clean sheets on your bed the night of your first shower and do not  sleep with pets. Day of Surgery : Do not apply any lotions/deodorants the morning of surgery.  Please wear clean clothes to the hospital/surgery center.  FAILURE TO FOLLOW THESE INSTRUCTIONS MAY RESULT IN THE CANCELLATION OF YOUR SURGERY PATIENT SIGNATURE_________________________________  NURSE SIGNATURE__________________________________  ________________________________________________________________________

## 2020-11-25 ENCOUNTER — Other Ambulatory Visit: Payer: Self-pay | Admitting: Obstetrics and Gynecology

## 2020-11-25 ENCOUNTER — Other Ambulatory Visit (HOSPITAL_COMMUNITY)
Admission: RE | Admit: 2020-11-25 | Discharge: 2020-11-25 | Disposition: A | Discharge status: Home / Self Care | Payer: BLUE CROSS/BLUE SHIELD | Source: Ambulatory Visit | Attending: Surgery | Admitting: Surgery

## 2020-11-25 DIAGNOSIS — Z01812 Encounter for preprocedural laboratory examination: Secondary | ICD-10-CM | POA: Insufficient documentation

## 2020-11-25 DIAGNOSIS — R928 Other abnormal and inconclusive findings on diagnostic imaging of breast: Secondary | ICD-10-CM

## 2020-11-25 DIAGNOSIS — Z20822 Contact with and (suspected) exposure to covid-19: Secondary | ICD-10-CM | POA: Insufficient documentation

## 2020-11-25 DIAGNOSIS — K8020 Calculus of gallbladder without cholecystitis without obstruction: Secondary | ICD-10-CM | POA: Diagnosis present

## 2020-11-25 DIAGNOSIS — K811 Chronic cholecystitis: Secondary | ICD-10-CM | POA: Diagnosis not present

## 2020-11-25 LAB — SARS CORONAVIRUS 2 (TAT 6-24 HRS): SARS Coronavirus 2: NEGATIVE

## 2020-11-26 NOTE — Anesthesia Preprocedure Evaluation (Signed)
Anesthesia Evaluation  Patient identified by MRN, date of birth, ID band Patient awake    Reviewed: Allergy & Precautions, NPO status , Patient's Chart, lab work & pertinent test results  History of Anesthesia Complications Negative for: history of anesthetic complications  Airway Mallampati: II  TM Distance: >3 FB Neck ROM: Full    Dental no notable dental hx. (+) Dental Advisory Given   Pulmonary neg pulmonary ROS,    Pulmonary exam normal        Cardiovascular negative cardio ROS Normal cardiovascular exam     Neuro/Psych negative neurological ROS     GI/Hepatic Neg liver ROS,   Endo/Other  negative endocrine ROS  Renal/GU negative Renal ROS     Musculoskeletal negative musculoskeletal ROS (+)   Abdominal   Peds  Hematology negative hematology ROS (+)   Anesthesia Other Findings   Reproductive/Obstetrics                            Anesthesia Physical Anesthesia Plan  ASA: II  Anesthesia Plan: General   Post-op Pain Management:    Induction: Intravenous  PONV Risk Score and Plan: 4 or greater and Ondansetron, Dexamethasone, Midazolam and Scopolamine patch - Pre-op  Airway Management Planned: Oral ETT  Additional Equipment:   Intra-op Plan:   Post-operative Plan: Extubation in OR  Informed Consent: I have reviewed the patients History and Physical, chart, labs and discussed the procedure including the risks, benefits and alternatives for the proposed anesthesia with the patient or authorized representative who has indicated his/her understanding and acceptance.     Dental advisory given  Plan Discussed with: Anesthesiologist and CRNA  Anesthesia Plan Comments:        Anesthesia Quick Evaluation

## 2020-11-28 ENCOUNTER — Encounter (HOSPITAL_COMMUNITY)
Admission: RE | Disposition: A | Discharge status: Home / Self Care | Payer: Self-pay | Source: Home / Self Care | Attending: Surgery

## 2020-11-28 ENCOUNTER — Encounter (HOSPITAL_COMMUNITY): Payer: Self-pay | Admitting: Surgery

## 2020-11-28 ENCOUNTER — Ambulatory Visit (HOSPITAL_COMMUNITY): Payer: BLUE CROSS/BLUE SHIELD | Admitting: Anesthesiology

## 2020-11-28 ENCOUNTER — Ambulatory Visit (HOSPITAL_COMMUNITY)
Admission: RE | Admit: 2020-11-28 | Discharge: 2020-11-28 | Disposition: A | Discharge status: Home / Self Care | Payer: BLUE CROSS/BLUE SHIELD | Attending: Surgery | Admitting: Surgery

## 2020-11-28 DIAGNOSIS — K811 Chronic cholecystitis: Secondary | ICD-10-CM | POA: Insufficient documentation

## 2020-11-28 DIAGNOSIS — Z20822 Contact with and (suspected) exposure to covid-19: Secondary | ICD-10-CM | POA: Insufficient documentation

## 2020-11-28 HISTORY — PX: CHOLECYSTECTOMY: SHX55

## 2020-11-28 LAB — PREGNANCY, URINE: Preg Test, Ur: NEGATIVE

## 2020-11-28 SURGERY — LAPAROSCOPIC CHOLECYSTECTOMY WITH INTRAOPERATIVE CHOLANGIOGRAM
Anesthesia: General

## 2020-11-28 MED ORDER — CELECOXIB 200 MG PO CAPS
200.0000 mg | ORAL_CAPSULE | Freq: Once | ORAL | Status: AC
  Administered 2020-11-28: 200 mg via ORAL
  Filled 2020-11-28: qty 1

## 2020-11-28 MED ORDER — ROCURONIUM BROMIDE 10 MG/ML (PF) SYRINGE
PREFILLED_SYRINGE | INTRAVENOUS | Status: DC | PRN
  Administered 2020-11-28: 80 mg via INTRAVENOUS

## 2020-11-28 MED ORDER — PROPOFOL 10 MG/ML IV BOLUS
INTRAVENOUS | Status: AC
  Filled 2020-11-28: qty 20

## 2020-11-28 MED ORDER — ATROPINE SULFATE 1 MG/10ML IJ SOSY
PREFILLED_SYRINGE | INTRAMUSCULAR | Status: AC
  Filled 2020-11-28: qty 10

## 2020-11-28 MED ORDER — TRAMADOL HCL 50 MG PO TABS: 50.0000 mg | ORAL_TABLET | Freq: Four times a day (QID) | ORAL | 0 refills | Status: AC | PRN

## 2020-11-28 MED ORDER — ROCURONIUM BROMIDE 10 MG/ML (PF) SYRINGE
PREFILLED_SYRINGE | INTRAVENOUS | Status: AC
  Filled 2020-11-28: qty 10

## 2020-11-28 MED ORDER — ATROPINE SULFATE 0.4 MG/ML IV SOSY
PREFILLED_SYRINGE | INTRAVENOUS | Status: DC | PRN
  Administered 2020-11-28: .2 mg via INTRAVENOUS

## 2020-11-28 MED ORDER — DEXAMETHASONE SODIUM PHOSPHATE 10 MG/ML IJ SOLN
INTRAMUSCULAR | Status: AC
  Filled 2020-11-28: qty 1

## 2020-11-28 MED ORDER — MIDAZOLAM HCL 2 MG/2ML IJ SOLN
INTRAMUSCULAR | Status: DC | PRN
  Administered 2020-11-28: 2 mg via INTRAVENOUS

## 2020-11-28 MED ORDER — GLYCOPYRROLATE PF 0.2 MG/ML IJ SOSY
PREFILLED_SYRINGE | INTRAMUSCULAR | Status: DC | PRN
  Administered 2020-11-28: .4 mg via INTRAVENOUS

## 2020-11-28 MED ORDER — SUGAMMADEX SODIUM 200 MG/2ML IV SOLN
INTRAVENOUS | Status: DC | PRN
  Administered 2020-11-28: 200 mg via INTRAVENOUS

## 2020-11-28 MED ORDER — CEFAZOLIN SODIUM-DEXTROSE 2-4 GM/100ML-% IV SOLN
2.0000 g | INTRAVENOUS | Status: AC
  Administered 2020-11-28: 2 g via INTRAVENOUS
  Filled 2020-11-28: qty 100

## 2020-11-28 MED ORDER — ACETAMINOPHEN 500 MG PO TABS
1000.0000 mg | ORAL_TABLET | ORAL | Status: AC
  Administered 2020-11-28: 1000 mg via ORAL

## 2020-11-28 MED ORDER — ONDANSETRON HCL 4 MG/2ML IJ SOLN
INTRAMUSCULAR | Status: DC | PRN
  Administered 2020-11-28: 4 mg via INTRAVENOUS

## 2020-11-28 MED ORDER — AMISULPRIDE (ANTIEMETIC) 5 MG/2ML IV SOLN: 10.0000 mg | Freq: Once | INTRAVENOUS | Status: DC | PRN

## 2020-11-28 MED ORDER — FENTANYL CITRATE (PF) 100 MCG/2ML IJ SOLN
25.0000 ug | INTRAMUSCULAR | Status: DC | PRN
  Administered 2020-11-28: 50 ug via INTRAVENOUS

## 2020-11-28 MED ORDER — DEXAMETHASONE SODIUM PHOSPHATE 10 MG/ML IJ SOLN
INTRAMUSCULAR | Status: DC | PRN
  Administered 2020-11-28: 10 mg via INTRAVENOUS

## 2020-11-28 MED ORDER — LIDOCAINE HCL (PF) 2 % IJ SOLN
INTRAMUSCULAR | Status: AC
  Filled 2020-11-28: qty 5

## 2020-11-28 MED ORDER — ONDANSETRON HCL 4 MG/2ML IJ SOLN
INTRAMUSCULAR | Status: AC
  Filled 2020-11-28: qty 2

## 2020-11-28 MED ORDER — OXYCODONE HCL 5 MG PO TABS
ORAL_TABLET | ORAL | Status: AC
  Filled 2020-11-28: qty 1

## 2020-11-28 MED ORDER — LACTATED RINGERS IV SOLN: INTRAVENOUS | Status: DC

## 2020-11-28 MED ORDER — EPHEDRINE 5 MG/ML INJ
INTRAVENOUS | Status: AC
  Filled 2020-11-28: qty 10

## 2020-11-28 MED ORDER — PROPOFOL 10 MG/ML IV BOLUS
INTRAVENOUS | Status: DC | PRN
  Administered 2020-11-28: 180 mg via INTRAVENOUS

## 2020-11-28 MED ORDER — FENTANYL CITRATE (PF) 100 MCG/2ML IJ SOLN
INTRAMUSCULAR | Status: AC
  Administered 2020-11-28: 50 ug via INTRAVENOUS
  Filled 2020-11-28: qty 2

## 2020-11-28 MED ORDER — CHLORHEXIDINE GLUCONATE CLOTH 2 % EX PADS: 6.0000 | MEDICATED_PAD | Freq: Once | CUTANEOUS | Status: DC

## 2020-11-28 MED ORDER — GLYCOPYRROLATE PF 0.2 MG/ML IJ SOSY
PREFILLED_SYRINGE | INTRAMUSCULAR | Status: AC
  Filled 2020-11-28: qty 1

## 2020-11-28 MED ORDER — EPHEDRINE SULFATE-NACL 50-0.9 MG/10ML-% IV SOSY
PREFILLED_SYRINGE | INTRAVENOUS | Status: DC | PRN
  Administered 2020-11-28: 10 mg via INTRAVENOUS

## 2020-11-28 MED ORDER — LIDOCAINE 2% (20 MG/ML) 5 ML SYRINGE
INTRAMUSCULAR | Status: DC | PRN
  Administered 2020-11-28: 50 mg via INTRAVENOUS

## 2020-11-28 MED ORDER — SCOPOLAMINE 1 MG/3DAYS TD PT72
1.0000 | MEDICATED_PATCH | TRANSDERMAL | Status: DC
  Administered 2020-11-28: 1.5 mg via TRANSDERMAL
  Filled 2020-11-28: qty 1

## 2020-11-28 MED ORDER — BUPIVACAINE-EPINEPHRINE (PF) 0.25% -1:200000 IJ SOLN
INTRAMUSCULAR | Status: AC
  Filled 2020-11-28: qty 30

## 2020-11-28 MED ORDER — FENTANYL CITRATE (PF) 250 MCG/5ML IJ SOLN
INTRAMUSCULAR | Status: DC | PRN
  Administered 2020-11-28: 100 ug via INTRAVENOUS

## 2020-11-28 MED ORDER — LACTATED RINGERS IR SOLN
Status: DC | PRN
  Administered 2020-11-28: 1000 mL

## 2020-11-28 MED ORDER — OXYCODONE HCL 5 MG PO TABS
5.0000 mg | ORAL_TABLET | Freq: Once | ORAL | Status: AC
Start: 2020-11-28 — End: 2020-11-28
  Administered 2020-11-28: 5 mg via ORAL

## 2020-11-28 MED ORDER — FENTANYL CITRATE (PF) 100 MCG/2ML IJ SOLN
INTRAMUSCULAR | Status: AC
  Filled 2020-11-28: qty 2

## 2020-11-28 MED ORDER — CHLORHEXIDINE GLUCONATE 0.12 % MT SOLN
15.0000 mL | Freq: Once | OROMUCOSAL | Status: AC
  Administered 2020-11-28: 15 mL via OROMUCOSAL

## 2020-11-28 MED ORDER — MIDAZOLAM HCL 2 MG/2ML IJ SOLN
INTRAMUSCULAR | Status: AC
  Filled 2020-11-28: qty 2

## 2020-11-28 MED ORDER — ACETAMINOPHEN 500 MG PO TABS
1000.0000 mg | ORAL_TABLET | Freq: Once | ORAL | Status: DC
  Filled 2020-11-28: qty 2

## 2020-11-28 MED ORDER — BUPIVACAINE-EPINEPHRINE 0.25% -1:200000 IJ SOLN
INTRAMUSCULAR | Status: DC | PRN
  Administered 2020-11-28: 30 mL

## 2020-11-28 SURGICAL SUPPLY — 40 items
APPLICATOR ARISTA FLEXITIP XL (MISCELLANEOUS) IMPLANT
APPLIER CLIP 5 13 M/L LIGAMAX5 (MISCELLANEOUS) ×2
APPLIER CLIP ROT 10 11.4 M/L (STAPLE)
CABLE HIGH FREQUENCY MONO STRZ (ELECTRODE) ×2 IMPLANT
CHLORAPREP W/TINT 26 (MISCELLANEOUS) ×2 IMPLANT
CLIP APPLIE 5 13 M/L LIGAMAX5 (MISCELLANEOUS) ×1 IMPLANT
CLIP APPLIE ROT 10 11.4 M/L (STAPLE) IMPLANT
COVER MAYO STAND STRL (DRAPES) IMPLANT
COVER SURGICAL LIGHT HANDLE (MISCELLANEOUS) ×2 IMPLANT
COVER WAND RF STERILE (DRAPES) IMPLANT
DECANTER SPIKE VIAL GLASS SM (MISCELLANEOUS) ×2 IMPLANT
DERMABOND ADVANCED (GAUZE/BANDAGES/DRESSINGS) ×1
DERMABOND ADVANCED .7 DNX12 (GAUZE/BANDAGES/DRESSINGS) ×1 IMPLANT
DISSECTOR BLUNT TIP ENDO 5MM (MISCELLANEOUS) IMPLANT
DRAPE C-ARM 42X120 X-RAY (DRAPES) IMPLANT
ELECT PENCIL ROCKER SW 15FT (MISCELLANEOUS) ×2 IMPLANT
ELECT REM PT RETURN 15FT ADLT (MISCELLANEOUS) ×2 IMPLANT
GLOVE INDICATOR 8.0 STRL GRN (GLOVE) ×2 IMPLANT
GLOVE SURG ENC MOIS LTX SZ7.5 (GLOVE) ×2 IMPLANT
GOWN STRL REUS W/TWL XL LVL3 (GOWN DISPOSABLE) ×4 IMPLANT
GRASPER SUT TROCAR 14GX15 (MISCELLANEOUS) IMPLANT
HEMOSTAT ARISTA ABSORB 3G PWDR (HEMOSTASIS) IMPLANT
HEMOSTAT SNOW SURGICEL 2X4 (HEMOSTASIS) IMPLANT
KIT BASIN OR (CUSTOM PROCEDURE TRAY) ×2 IMPLANT
NEEDLE INSUFFLATION 14GA 120MM (NEEDLE) IMPLANT
POUCH SPECIMEN RETRIEVAL 10MM (ENDOMECHANICALS) ×2 IMPLANT
SCISSORS LAP 5X35 DISP (ENDOMECHANICALS) ×2 IMPLANT
SET CHOLANGIOGRAPH MIX (MISCELLANEOUS) IMPLANT
SET IRRIG TUBING LAPAROSCOPIC (IRRIGATION / IRRIGATOR) ×2 IMPLANT
SET TUBE SMOKE EVAC HIGH FLOW (TUBING) ×2 IMPLANT
SLEEVE ADV FIXATION 5X100MM (TROCAR) ×4 IMPLANT
SUT MNCRL AB 4-0 PS2 18 (SUTURE) ×2 IMPLANT
SYR 10ML ECCENTRIC (SYRINGE) ×2 IMPLANT
TOWEL OR 17X26 10 PK STRL BLUE (TOWEL DISPOSABLE) ×2 IMPLANT
TOWEL OR NON WOVEN STRL DISP B (DISPOSABLE) IMPLANT
TRAY LAPAROSCOPIC (CUSTOM PROCEDURE TRAY) ×2 IMPLANT
TROCAR ADV FIXATION 12X100MM (TROCAR) IMPLANT
TROCAR ADV FIXATION 5X100MM (TROCAR) ×2 IMPLANT
TROCAR XCEL BLUNT TIP 100MML (ENDOMECHANICALS) ×2 IMPLANT
TROCAR XCEL NON-BLD 11X100MML (ENDOMECHANICALS) IMPLANT

## 2020-11-28 NOTE — Interval H&P Note (Signed)
History and Physical Interval Note:  11/28/2020 8:32 AM  Nancy Hendricks  has presented today for surgery, with the diagnosis of SYMPTOMATIC CHOLELITHIASIS.  The various methods of treatment have been discussed with her. After consideration of risks, benefits and other options for treatment, she has consented to  Procedure(s): LAPAROSCOPIC CHOLECYSTECTOMY WITH POSSIBLE INTRAOPERATIVE CHOLANGIOGRAM (N/A) as a surgical intervention.  The patient's history has been reviewed, patient examined, she denies any changes in her health or health history since we met in the office. She is stable for surgery.  I have reviewed the patient's chart and labs.  Questions were answered to the patient's satisfaction, she has expressed understanding and has elected to proceed with surgery.     [place]

## 2020-11-28 NOTE — Op Note (Signed)
11/28/2020 9:36 AM  PATIENT: Nancy Hendricks  40 y.o. female  Patient Care Team: Patient, No Pcp Per as PCP - General (General Practice)  PRE-OPERATIVE DIAGNOSIS: Symptomatic cholelithiasis  POST-OPERATIVE DIAGNOSIS: Same  PROCEDURE: Laparoscopic cholecystectomy  SURGEON: Marin Olp, MD  ANESTHESIA: General endotracheal  EBL: Total I/O In: 100 [IV Piggyback:100] Out: -   DRAINS: None  SPECIMEN: Gallbladder  COUNTS: Sponge, needle and instrument counts were reported correct x2 at the conclusion of the operation  DISPOSITION: PACU in satisfactory condition  COMPLICATIONS: None  FINDINGS: Normal appearing liver with nice sharp edges. Gallbladder with mild scar tissue on the infundibulum consist. Small gallstones  DESCRIPTION:  The patient was identified & brought into the operating room. She was then positioned supine on the OR table. SCDs were in place and active during the entire case. She then underwent general endotracheal anesthesia. Pressure points were padded. Hair on the abdomen was clipped by the OR team. The abdomen was prepped and draped in the standard sterile fashion. Antibiotics were administered. A surgical timeout was performed and confirmed our plan.   A periumbilical incision was made. The umbilical stalk was grasped and retracted outwardly. The infraumbilical fascia was identified and incised. The peritoneal cavity was gently entered bluntly. A purse-string 0 Vicryl suture was placed. The Hasson cannula was inserted into the peritoneal cavity and insufflation with CO2 commenced to . A laparoscope was inserted into the peritoneal cavity and inspection confirmed no evidence of trocar site complications. The patient was then positioned in reverse Trendelenburg with slight left side down. 3 additional 1mm trocars were placed along the right subcostal line - one 44mm port in mid subcostal region, another 70mm port in the right flank near the anterior axillary  line, and a third 37mm port in the left subxiphoid region obliquely near the falciform ligament.  The liver and gallbladder were inspected. The liver is normal in appearance with smooth/sharp edges. The gallbladder fundus was grasped and elevated cephalad. An additional grasper was then placed on the infundibulum of the gallbladder and the infundibulum was retracted laterally. Staying high on the gallbladder, the peritoneum on both sides of the gallbladder was opened with hook cautery. Gentle blunt dissection was then employed with a Art gallery manager working down into [employer]. The cystic duct was identified and carefully circumferentially dissected. The cystic artery was also identified and carefully circumferentially dissected. The space between the cystic artery and hepatocystic plate was developed such that a good view of the liver could be seen through a window medial to the cystic artery. The triangle of Calot had been cleared of all fibrofatty tissue. At this point, a critical view of safety was achieved and the only structures visualized was the skeletonized cystic duct laterally, the skeletonized cystic artery and the liver through the window medial to the artery. A diminuitive posterior cystic artery was noted.  A cholangiogram was not performed at this point as the cystic duct is quite small and fragile and potential for duct tearing during partial 'ductotomy.'  The cystic duct and artery were clipped with 2 clips on the patient side and 1 clip on the specimen side. The cystic duct and artery were then divided. The gallbladder was then freed from its remaining attachments to the liver using electrocautery. As we traced her posterior cystic artery higher onto the gallbladder it became diminiuitive in size. This was then controlled with clip distally near its termination on the gallbladder. This was then divided. The remaining gallbladder attachments were freed.  A small amount of bile  spillage occurred during taking the gallbladder off the liver. This was then evacuated with the suction irrigator. The gallbladder was placed into an endocatch bag. The RUQ was gently irrigated with sterile saline until the effluent ran clear. Hemostasis was then verified. The clips were in good position; the gallbladder fossa was dry. The rest of the abdomen was inspected no injury nor bleeding elsewhere was identified.  The endocatch bag containing the gallbladder was then removed from the umbilical port site and passed off as specimen. The RUQ ports were removed under direct visualization and noted to be hemostatic. The umbilical fascia was then closed using the 0 Vicryl purse-string suture. The fascia was palpated and noted to be completely closed. The skin of all incision sites was approximated with 4-0 monocryl subcuticular suture and dermabond applied. She was then awakened from anesthesia, extubated, and transferred to a stretcher for transport to PACU in satisfactory condition.

## 2020-11-28 NOTE — Anesthesia Procedure Notes (Signed)
Procedure Name: Intubation Date/Time: 11/28/2020 8:39 AM Performed by: Shary Decamp, CRNA Pre-anesthesia Checklist: Patient identified, Patient being monitored, Timeout performed, Emergency Drugs available and Suction available Patient Re-evaluated:Patient Re-evaluated prior to induction Oxygen Delivery Method: Circle System Utilized Preoxygenation: Pre-oxygenation with 100% oxygen Induction Type: IV induction Ventilation: Mask ventilation without difficulty Laryngoscope Size: Miller and 2 Grade View: Grade I Tube type: Oral Tube size: 7.0 mm Number of attempts: 1 Airway Equipment and Method: Stylet Placement Confirmation: ETT inserted through vocal cords under direct vision,  positive ETCO2 and breath sounds checked- equal and bilateral Secured at: 21 cm Tube secured with: Tape Dental Injury: Teeth and Oropharynx as per pre-operative assessment

## 2020-11-28 NOTE — Transfer of Care (Signed)
Immediate Anesthesia Transfer of Care Note  Patient: Nancy Hendricks  Procedure(s) Performed: LAPAROSCOPIC CHOLECYSTECTOMY (N/A )  Patient Location: PACU  Anesthesia Type:General  Level of Consciousness: drowsy and patient cooperative  Airway & Oxygen Therapy: Patient Spontanous Breathing and Patient connected to face mask oxygen  Post-op Assessment: Report given to RN, Post -op Vital signs reviewed and stable and Patient moving all extremities X 4  Post vital signs: Reviewed and stable  Last Vitals:  Vitals Value Taken Time  BP 137/73 11/28/20 0956  Temp    Pulse 81 11/28/20 0958  Resp 20 11/28/20 0958  SpO2 100 % 11/28/20 0958  Vitals shown include unvalidated device data.  Last Pain:  Vitals:   11/28/20 0730  TempSrc:   PainSc: 0-No pain         Complications: No complications documented.

## 2020-11-28 NOTE — Anesthesia Postprocedure Evaluation (Signed)
Anesthesia Post Note  Patient: Nancy Hendricks  Procedure(s) Performed: LAPAROSCOPIC CHOLECYSTECTOMY (N/A )     Patient location during evaluation: PACU Anesthesia Type: General Level of consciousness: sedated Pain management: pain level controlled Vital Signs Assessment: post-procedure vital signs reviewed and stable Respiratory status: spontaneous breathing and respiratory function stable Cardiovascular status: stable Postop Assessment: no apparent nausea or vomiting Anesthetic complications: no   No complications documented.  Last Vitals:  Vitals:   11/28/20 1115 11/28/20 1130  BP: 122/72 119/75  Pulse: (!) 58 (!) 53  Resp: 17 16  Temp:  36.9 C  SpO2: 97% 100%    Last Pain:  Vitals:   11/28/20 1225  TempSrc:   PainSc: 7                  Lukka Black DANIEL

## 2020-11-29 ENCOUNTER — Encounter (HOSPITAL_COMMUNITY): Payer: Self-pay | Admitting: Surgery

## 2020-11-29 LAB — SURGICAL PATHOLOGY

## 2020-12-08 ENCOUNTER — Ambulatory Visit
Admission: RE | Admit: 2020-12-08 | Discharge: 2020-12-08 | Disposition: A | Discharge status: Home / Self Care | Payer: BLUE CROSS/BLUE SHIELD | Source: Ambulatory Visit | Attending: Obstetrics and Gynecology | Admitting: Obstetrics and Gynecology

## 2020-12-08 ENCOUNTER — Other Ambulatory Visit: Payer: Self-pay

## 2020-12-08 DIAGNOSIS — R928 Other abnormal and inconclusive findings on diagnostic imaging of breast: Secondary | ICD-10-CM

## 2022-04-22 ENCOUNTER — Ambulatory Visit
Admission: EM | Admit: 2022-04-22 | Discharge: 2022-04-22 | Disposition: A | Discharge status: Home / Self Care | Payer: Commercial Managed Care - HMO | Attending: Urgent Care | Admitting: Urgent Care

## 2022-04-22 ENCOUNTER — Encounter: Payer: Self-pay | Admitting: Emergency Medicine

## 2022-04-22 DIAGNOSIS — B029 Zoster without complications: Secondary | ICD-10-CM | POA: Diagnosis not present

## 2022-04-22 MED ORDER — GABAPENTIN 100 MG PO CAPS
100.0000 mg | ORAL_CAPSULE | Freq: Three times a day (TID) | ORAL | 1 refills | Status: AC
Start: 2022-04-22 — End: ?

## 2022-04-22 MED ORDER — VALACYCLOVIR HCL 1 G PO TABS
1000.0000 mg | ORAL_TABLET | Freq: Three times a day (TID) | ORAL | 0 refills | Status: AC
Start: 2022-04-22 — End: ?

## 2022-04-22 NOTE — ED Triage Notes (Signed)
Rash to left abdomin starting Friday. Reports it to be extremely painful. Unilateral, minor itching. Worried about shingles.

## 2022-04-22 NOTE — ED Provider Notes (Signed)
[facility]   MRN: [id number] DOB: [birth year]  Subjective:   Nancy Hendricks is a 41 y.o. female presenting for 3-day history of acute onset persistent and worsening right-sided rash that is painful over the lower flank side.  Patient has a history of chickenpox.  No current facility-administered medications for this encounter.  Current Outpatient Medications:    omeprazole (PRILOSEC) 20 MG capsule, Take 1 capsule (20 mg total) by mouth daily. (Patient taking differently: Take 20 mg by mouth daily as needed (reflux).), Disp: 30 capsule, Rfl: 0   Allergies  Allergen Reactions   Other Itching    Egg plant    Past Medical History:  Diagnosis Date   Complication of anesthesia    pt has woken up during some of her surgeries    Family history of adverse reaction to anesthesia    mother- has problems with N/V    Heart murmur    Vaginal Pap smear, abnormal      Past Surgical History:  Procedure Laterality Date   CHOLECYSTECTOMY N/A 11/28/2020   Procedure: LAPAROSCOPIC CHOLECYSTECTOMY;  Surgeon: Andria Meuse, MD;  Location: [facility];  Service: General;  Laterality: N/A;   CRYOTHERAPY     FACIAL RECONSTRUCTION SURGERY     REMOVAL OF BILATERAL TISSUE EXPANDERS WITH PLACEMENT OF BILATERAL BREAST IMPLANTS      Family History  Problem Relation Age of Onset   Hypertension Father    Cancer Neg Hx    Diabetes Neg Hx     Social History   Tobacco Use   Smoking status: Never   Smokeless tobacco: Never  Vaping Use   Vaping Use: Never used  Substance Use Topics   Alcohol use: Yes    Comment: rare   Drug use: No    ROS   Objective:   Vitals: BP (!) 154/82 (BP Location: Left Arm)   Pulse 66   Temp 98.8 F (37.1 C) (Oral)   Resp 16   SpO2 98%   Physical Exam Constitutional:      General: She is not in acute distress.    Appearance: Normal appearance. She is well-developed. She is not ill-appearing, toxic-appearing or diaphoretic.  HENT:      Head: Normocephalic and atraumatic.     Nose: Nose normal.     Mouth/Throat:     Mouth: Mucous membranes are moist.  Eyes:     General: No scleral icterus.       Right eye: No discharge.        Left eye: No discharge.     Extraocular Movements: Extraocular movements intact.  Cardiovascular:     Rate and Rhythm: Normal rate.  Pulmonary:     Effort: Pulmonary effort is normal.  Skin:    General: Skin is warm and dry.     Findings: Rash (Clusters of vesicular lesions over the lower right flank site/abdomen, none observed over the right back) present.  Neurological:     General: No focal deficit present.     Mental Status: She is alert and oriented to person, place, and time.  Psychiatric:        Mood and Affect: Mood normal.        Behavior: Behavior normal.     Assessment and Plan :   PDMP not reviewed this encounter.  1. Herpes zoster without complication    Patient has prototypical shingles rash.  Discussed pain management but she reports that the pain is very manageable now.  Recommended Tylenol, ibuprofen, gabapentin as needed.  Start Valtrex for the next 10 days. Counseled patient on potential for adverse effects with medications prescribed/recommended today, ER and return-to-clinic precautions discussed, patient verbalized understanding.    Wallis Bamberg, PA-C 04/22/22 1505

## 2022-05-03 ENCOUNTER — Encounter: Payer: Self-pay | Admitting: Emergency Medicine

## 2022-05-03 ENCOUNTER — Ambulatory Visit
Admission: EM | Admit: 2022-05-03 | Discharge: 2022-05-03 | Disposition: A | Discharge status: Home / Self Care | Payer: Commercial Managed Care - HMO | Attending: Emergency Medicine | Admitting: Emergency Medicine

## 2022-05-03 DIAGNOSIS — B029 Zoster without complications: Secondary | ICD-10-CM | POA: Diagnosis not present

## 2022-05-03 DIAGNOSIS — B0229 Other postherpetic nervous system involvement: Secondary | ICD-10-CM | POA: Diagnosis not present

## 2022-05-03 MED ORDER — LIDOCAINE 3 % EX CREA: 1.0000 | TOPICAL_CREAM | CUTANEOUS | 1 refills | Status: AC | PRN

## 2022-05-03 MED ORDER — GABAPENTIN 300 MG PO CAPS: 300.0000 mg | ORAL_CAPSULE | Freq: Three times a day (TID) | ORAL | 1 refills | Status: AC

## 2022-05-03 NOTE — Discharge Instructions (Signed)
Please begin gabapentin 300 mg 3 times daily to see if this provides you with better pain relief.  If you would like to either add another 100 mg to make your dose 400 mg 3 times daily or even double your 300 mg tablets to take 600 mg 3 times daily, these are all safe doses to take.  You may find that your daytime dose does not need to be as high as your nighttime dose.  Just keep in mind that the highest possible full dose is 800 mg 3 times daily.  I also took the liberty of prescribing the lidocaine cream for your comfort.  If you do not want to fill the prescription you do not have to.  You may find it helpful to place lidocaine on your skin and cover it with the occlusive bandage to hold it in place, this may be particularly helpful at night.  Thank you for visiting urgent care today.  Please let us know if there is anything else we can do for you.

## 2022-05-03 NOTE — ED Triage Notes (Signed)
Pt here with continuation of shingles infection despite treatment. There has been no improvement in sxs.

## 2022-05-03 NOTE — ED Provider Notes (Signed)
[facility]    CSN: [id number] Arrival date & time: 05/03/22  0865    HISTORY   Chief Complaint  Patient presents with   Rash   HPI Nancy Hendricks is a pleasant, 41 y.o. female who presents to urgent care today. Patient presents today for follow-up of diagnosis of shingles on March 23, 2022.  States that she completed valacyclovir and has not seen the appearance of any new lesions however she still having significant amount of pain.  Patient states she is still able to function do all of her regular activities without limitation but pain is aggravating at times.  Patient states the gabapentin helps slightly and feels that a higher dose might be more helpful.  The history is provided by the patient.   Past Medical History:  Diagnosis Date   Complication of anesthesia    pt has woken up during some of her surgeries    Family history of adverse reaction to anesthesia    mother- has problems with N/V    Heart murmur    Vaginal Pap smear, abnormal    There are no problems to display for this patient.  Past Surgical History:  Procedure Laterality Date   CHOLECYSTECTOMY N/A 11/28/2020   Procedure: LAPAROSCOPIC CHOLECYSTECTOMY;  Surgeon: Andria Meuse, MD;  Location: [facility];  Service: General;  Laterality: N/A;   CRYOTHERAPY     FACIAL RECONSTRUCTION SURGERY     REMOVAL OF BILATERAL TISSUE EXPANDERS WITH PLACEMENT OF BILATERAL BREAST IMPLANTS     OB History     Gravida  0   Para  0   Term  0   Preterm  0   AB  0   Living  0      SAB  0   IAB  0   Ectopic  0   Multiple  0   Live Births  0          Home Medications    Prior to Admission medications   Medication Sig Start Date End Date Taking? Authorizing Provider  gabapentin (NEURONTIN) 300 MG capsule Take 1 capsule (300 mg total) by mouth 3 (three) times daily. 05/03/22 07/02/22 Yes Theadora Rama Scales, PA-C  Lidocaine 3 % CREA Apply 1 Application topically every 4 (four) hours as  needed (post herpetic neuralgia). 05/03/22  Yes Theadora Rama Scales, PA-C  gabapentin (NEURONTIN) 100 MG capsule Take 1 capsule (100 mg total) by mouth 3 (three) times daily. 04/22/22   Wallis Bamberg, PA-C  omeprazole (PRILOSEC) 20 MG capsule Take 1 capsule (20 mg total) by mouth daily. Patient taking differently: Take 20 mg by mouth daily as needed (reflux). 11/06/20   Palumbo, April, MD  valACYclovir (VALTREX) 1000 MG tablet Take 1 tablet (1,000 mg total) by mouth 3 (three) times daily. 04/22/22   Wallis Bamberg, PA-C    Family History Family History  Problem Relation Age of Onset   Hypertension Father    Cancer Neg Hx    Diabetes Neg Hx    Social History Social History   Tobacco Use   Smoking status: Never   Smokeless tobacco: Never  Vaping Use   Vaping Use: Never used  Substance Use Topics   Alcohol use: Yes    Comment: rare   Drug use: No   Allergies   Other  Review of Systems Review of Systems Pertinent findings revealed after performing a 14 point review of systems has been noted in the history of present illness.  Physical Exam  Triage Vital Signs ED Triage Vitals  Enc Vitals Group     BP 08/11/21 0827 (!) 147/82     Pulse Rate 08/11/21 0827 72     Resp 08/11/21 0827 18     Temp 08/11/21 0827 98.3 F (36.8 C)     Temp Source 08/11/21 0827 Oral     SpO2 08/11/21 0827 98 %     Weight --      Height --      Head Circumference --      Peak Flow --      Pain Score 08/11/21 0826 5     Pain Loc --      Pain Edu? --      Excl. in GC? --   No data found.  Updated Vital Signs BP 134/85   Pulse (!) 50   Temp 98.2 F (36.8 C)   Resp 18   SpO2 97%   Physical Exam Constitutional:      General: She is not in acute distress.    Appearance: Normal appearance. She is well-developed. She is not ill-appearing, toxic-appearing or diaphoretic.  HENT:     Head: Normocephalic and atraumatic.     Nose: Nose normal.     Mouth/Throat:     Mouth: Mucous membranes are moist.   Eyes:     General: No scleral icterus.       Right eye: No discharge.        Left eye: No discharge.     Extraocular Movements: Extraocular movements intact.  Cardiovascular:     Rate and Rhythm: Normal rate.  Pulmonary:     Effort: Pulmonary effort is normal.  Skin:    General: Skin is warm and dry.     Findings: Rash (Linear pattern of clustered vesicular lesions on erythematous base in various stages of healing present at right lower abdomen radiating from umbilicus to side concerning for shingles without sign of superficial infection.) present.  Neurological:     General: No focal deficit present.     Mental Status: She is alert and oriented to person, place, and time.  Psychiatric:        Mood and Affect: Mood normal.        Behavior: Behavior normal.     Visual Acuity Right Eye Distance:   Left Eye Distance:   Bilateral Distance:    Right Eye Near:   Left Eye Near:    Bilateral Near:     UC Couse / Diagnostics / Procedures:     Radiology No results found.  Procedures Procedures (including critical care time) EKG  Pending results:  Labs Reviewed - No data to display  Medications Ordered in UC: Medications - No data to display  UC Diagnoses / Final Clinical Impressions(s)   I have reviewed the triage vital signs and the nursing notes.  Pertinent labs & imaging results that were available during my care of the patient were reviewed by me and considered in my medical decision making (see chart for details).    Final diagnoses:  Herpes zoster without complication  Postherpetic neuralgia   Patient provided with a higher dose of gabapentin that she is welcome to try, patient was also advised that if she wants to take gabapentin 300 mg with the gabapentin 100 mg she is welcome to do that as well.  Patient advised not to take more than 2400 milligrams in a 24-hour period.  Patient also provided with a prescription for lidocaine cream and occlusive  dressings for  topical pain relief.  Patient advised that repeating antiviral therapy would provide no benefit as the lesions are no longer spreading.  Return precautions advised.  ED Prescriptions     Medication Sig Dispense Auth. Provider   gabapentin (NEURONTIN) 300 MG capsule Take 1 capsule (300 mg total) by mouth 3 (three) times daily. 90 capsule Theadora Rama Scales, PA-C   Lidocaine 3 % CREA Apply 1 Application topically every 4 (four) hours as needed (post herpetic neuralgia). 28.35 g Theadora Rama Scales, PA-C      PDMP not reviewed this encounter.  Pending results:  Labs Reviewed - No data to display  Discharge Instructions:   Discharge Instructions      Please begin gabapentin 300 mg 3 times daily to see if this provides you with better pain relief.  If you would like to either add another 100 mg to make your dose 400 mg 3 times daily or even double your 300 mg tablets to take 600 mg 3 times daily, these are all safe doses to take.  You may find that your daytime dose does not need to be as high as your nighttime dose.  Just keep in mind that the highest possible full dose is 800 mg 3 times daily.  I also took the liberty of prescribing the lidocaine cream for your comfort.  If you do not want to fill the prescription you do not have to.  You may find it helpful to place lidocaine on your skin and cover it with the occlusive bandage to hold it in place, this may be particularly helpful at night.  Thank you for visiting urgent care today.  Please let us know if there is anything else we can do for you.    Disposition Upon Discharge:  Condition: stable for discharge home  Patient presented with an acute illness with associated systemic symptoms and significant discomfort requiring urgent management. In my opinion, this is a condition that a prudent lay person (someone who possesses an average knowledge of health and medicine) may potentially expect to result in complications if not  addressed urgently such as respiratory distress, impairment of bodily function or dysfunction of bodily organs.   Routine symptom specific, illness specific and/or disease specific instructions were discussed with the patient and/or caregiver at length.   As such, the patient has been evaluated and assessed, work-up was performed and treatment was provided in alignment with urgent care protocols and evidence based medicine.  Patient/parent/caregiver has been advised that the patient may require follow up for further testing and treatment if the symptoms continue in spite of treatment, as clinically indicated and appropriate.  Patient/parent/caregiver has been advised to return to the [facility] or PCP if no better; to PCP or the Emergency Department if new signs and symptoms develop, or if the current signs or symptoms continue to change or worsen for further workup, evaluation and treatment as clinically indicated and appropriate  The patient will follow up with their current PCP if and as advised. If the patient does not currently have a PCP we will assist them in obtaining one.   The patient may need specialty follow up if the symptoms continue, in spite of conservative treatment and management, for further workup, evaluation, consultation and treatment as clinically indicated and appropriate.   Patient/parent/caregiver verbalized understanding and agreement of plan as discussed.  All questions were addressed during visit.  Please see discharge instructions below for further details of plan.  This office note  has been dictated using Teaching laboratory technician.  Unfortunately, this method of dictation can sometimes lead to typographical or grammatical errors.  I apologize for your inconvenience in advance if this occurs.  Please do not hesitate to reach out to me if clarification is needed.      Theadora Rama Scales, PA-C 05/03/22 1101

## 2023-02-12 IMAGING — MG MM DIGITAL DIAGNOSTIC UNILAT*R* IMPLANT W/ TOMO W/ CAD
4 series · 4 of 12 positions shown · non-contrast
Comparison: Screening mammogram dated 11/16/2020.

CLINICAL DATA: Screening recall for right breast mass.

EXAM:
DIGITAL DIAGNOSTIC UNILATERAL RIGHT MAMMOGRAM WITH IMPLANTS, CAD AND
TOMOSYNTHESIS; ULTRASOUND RIGHT BREAST LIMITED
TECHNIQUE: Right digital diagnostic mammography and breast tomosynthesis was
performed. The images were evaluated with computer-aided detection.
Standard and/or implant displaced views were performed.; Targeted
ultrasound examination of the right breast was performed

[R CC synth-2D]
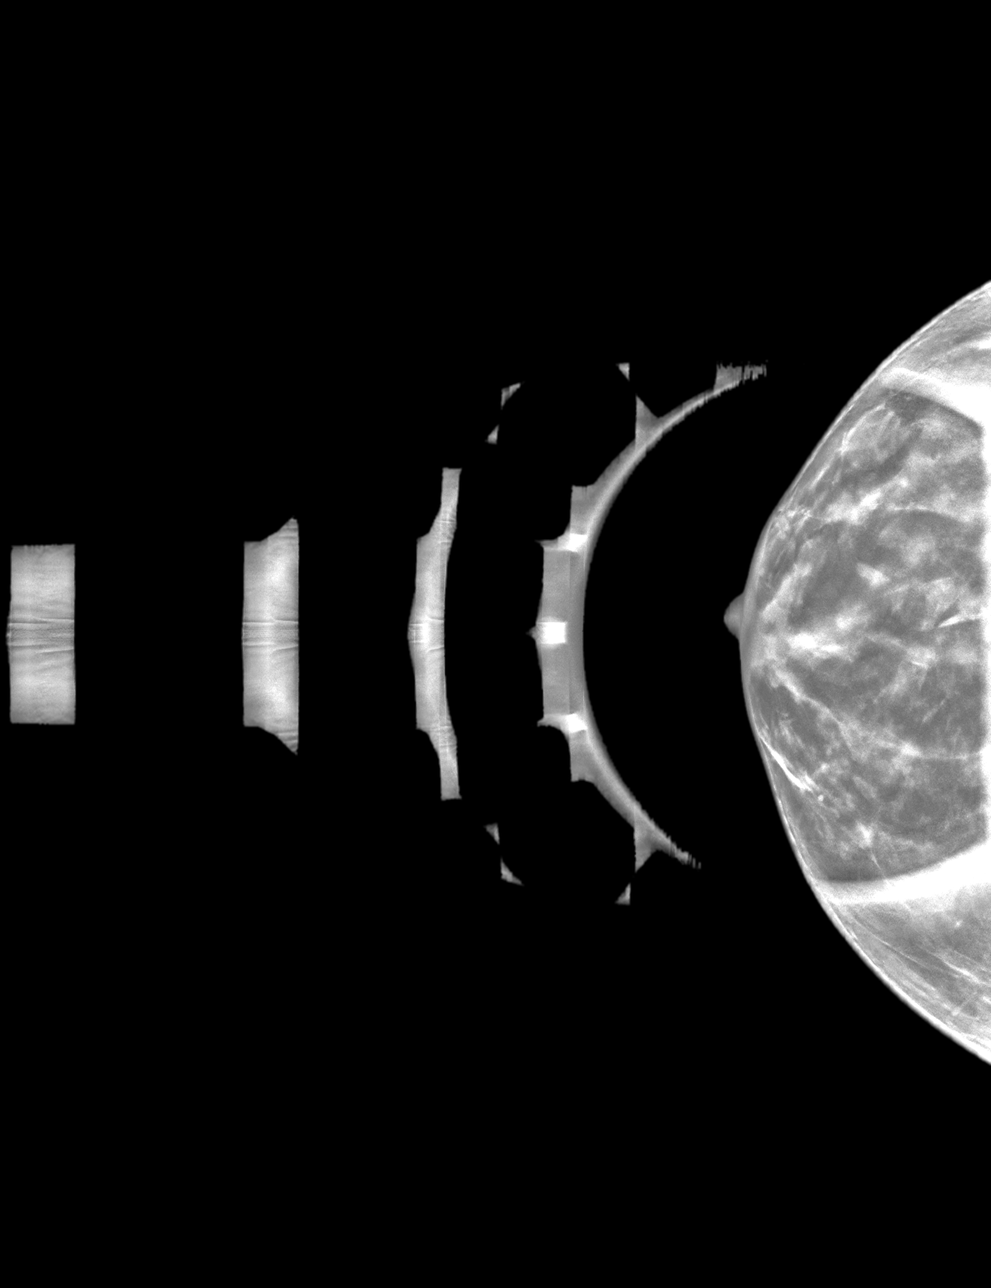

[R MLO synth-2D]
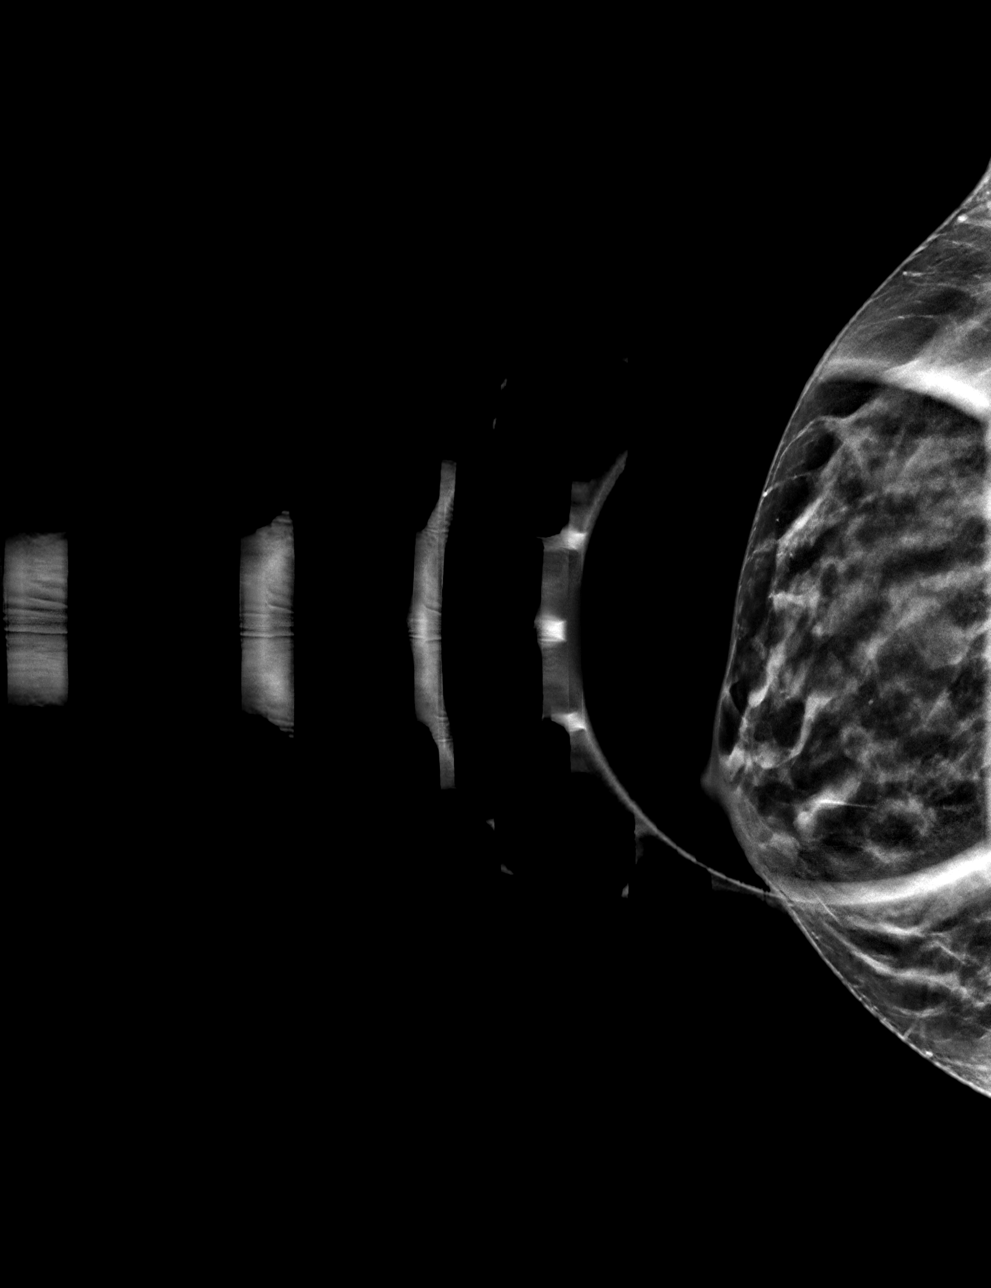

[R CCID BREAST TOMOSYNTHESIS IMAGE tomo · tomo slice 26/51.0]
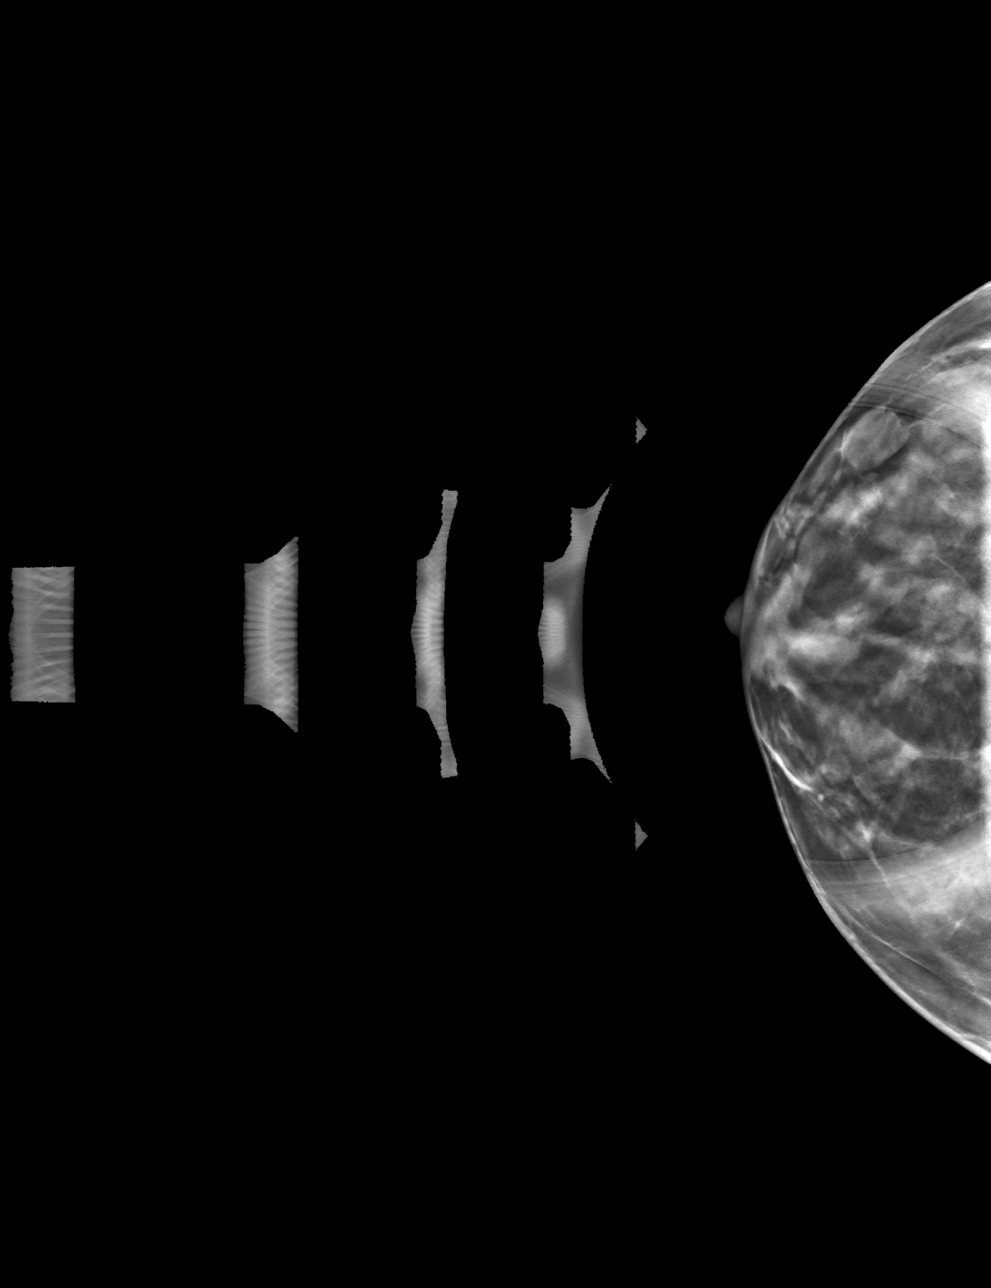

[R MLOID BREAST TOMOSYNTHESIS IMAGE tomo · tomo slice 28/55.0]
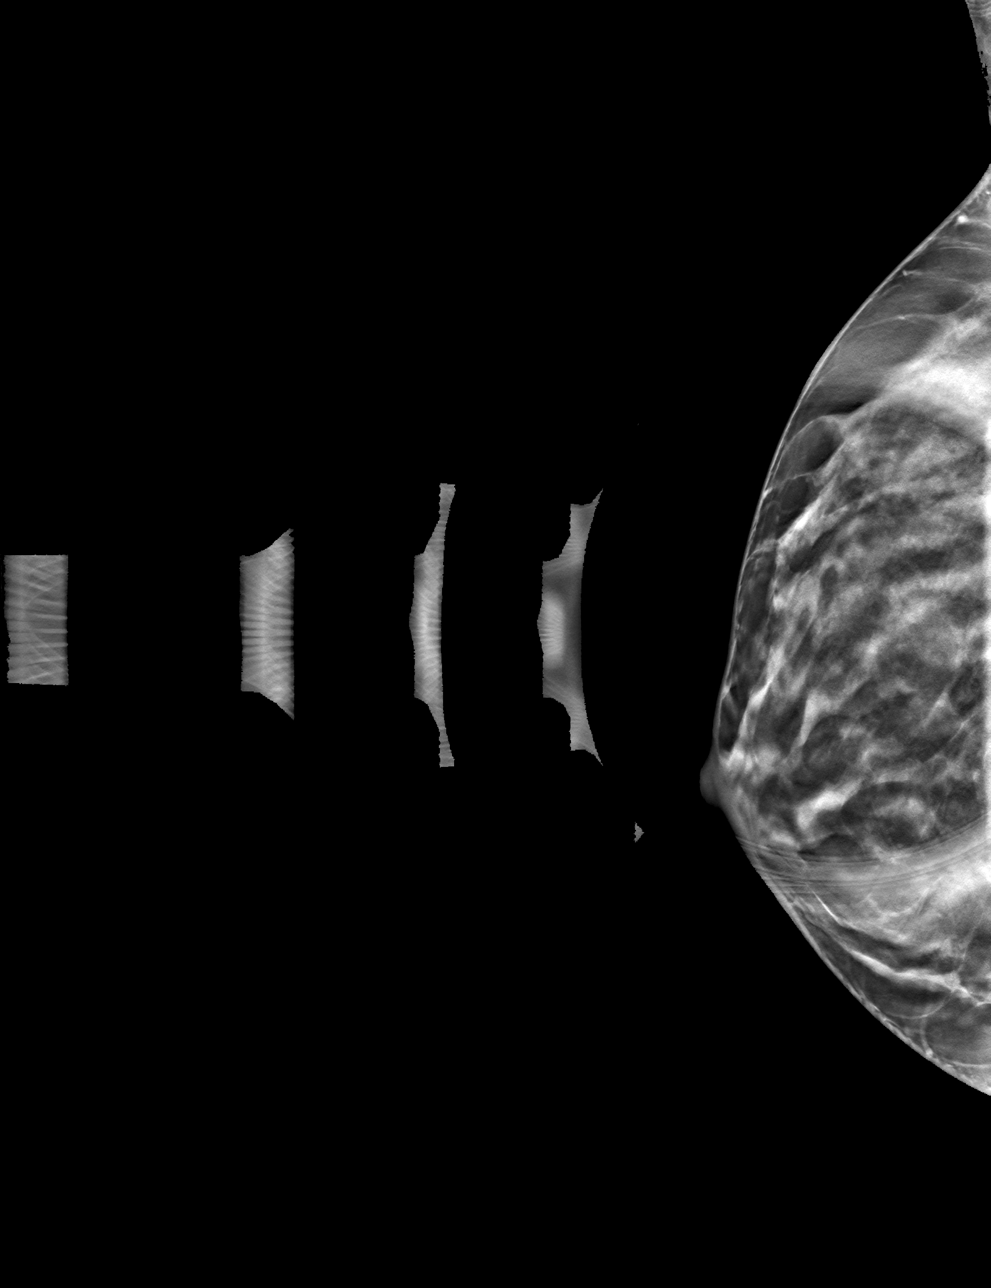

[4 of 12 positions shown; findings below may reference images not displayed]

ACR Breast Density Category c: The breast tissue is heterogeneously
dense, which may obscure small masses.
FINDINGS: Spot compression tomograms were performed of the right breast. There
is an oval circumscribed mass in the slightly outer right breast
measuring 1.1 cm and an adjacent oval mass measuring 1.3 cm.

Targeted ultrasound of the right breast was performed demonstrating
several cysts. A cyst at 9 o'clock 3 cm from nipple measures 1.5 x
0.6 x 1.2 cm and an additional cyst also at 9 o'clock 3 cm from
nipple measures 1 x 0.5 x 1 cm. These cysts correspond well with the
masses seen in the right breast at mammography.

The patient has retropectoral implants.
IMPRESSION: No findings of malignancy in the right breast.

RECOMMENDATION:
Screening mammogram in one year.(Code:PR-4-P3M)

I have discussed the findings and recommendations with the patient.
If applicable, a reminder letter will be sent to the patient
regarding the next appointment.

BI-RADS CATEGORY  2: Benign.
